# Patient Record
Sex: Female | Born: 1975 | Race: White | Hispanic: No | Smoking: Current every day smoker
Health system: Southern US, Community
[De-identification: ages and names within clinical notes are randomized; demographics above are authoritative.]

## PROBLEM LIST (undated history)

## (undated) DIAGNOSIS — J309 Allergic rhinitis, unspecified: Secondary | ICD-10-CM

## (undated) HISTORY — PX: TUBAL LIGATION: SHX77

## (undated) HISTORY — DX: Allergic rhinitis, unspecified: J30.9

---

## 1998-07-14 ENCOUNTER — Other Ambulatory Visit: Admission: RE | Admit: 1998-07-14 | Discharge: 1998-07-14 | Payer: Self-pay | Admitting: Gynecology

## 1999-06-27 ENCOUNTER — Emergency Department (HOSPITAL_COMMUNITY): Admission: EM | Admit: 1999-06-27 | Discharge: 1999-06-28 | Payer: Self-pay | Admitting: *Deleted

## 1999-08-17 ENCOUNTER — Other Ambulatory Visit: Admission: RE | Admit: 1999-08-17 | Discharge: 1999-08-17 | Payer: Self-pay | Admitting: Gynecology

## 1999-12-11 ENCOUNTER — Emergency Department (HOSPITAL_COMMUNITY): Admission: EM | Admit: 1999-12-11 | Discharge: 1999-12-11 | Payer: Self-pay | Admitting: Emergency Medicine

## 1999-12-29 ENCOUNTER — Ambulatory Visit (HOSPITAL_COMMUNITY): Admission: RE | Admit: 1999-12-29 | Discharge: 1999-12-29 | Payer: Self-pay | Admitting: Internal Medicine

## 1999-12-29 ENCOUNTER — Encounter: Payer: Self-pay | Admitting: Internal Medicine

## 2000-09-02 ENCOUNTER — Other Ambulatory Visit: Admission: RE | Admit: 2000-09-02 | Discharge: 2000-09-02 | Payer: Self-pay | Admitting: Obstetrics and Gynecology

## 2000-12-24 ENCOUNTER — Ambulatory Visit (HOSPITAL_COMMUNITY): Admission: RE | Admit: 2000-12-24 | Discharge: 2000-12-24 | Payer: Self-pay | Admitting: Obstetrics and Gynecology

## 2001-03-05 ENCOUNTER — Encounter: Payer: Self-pay | Admitting: Obstetrics and Gynecology

## 2001-03-05 ENCOUNTER — Ambulatory Visit (HOSPITAL_COMMUNITY): Admission: RE | Admit: 2001-03-05 | Discharge: 2001-03-05 | Payer: Self-pay | Admitting: Obstetrics and Gynecology

## 2001-03-06 ENCOUNTER — Inpatient Hospital Stay (HOSPITAL_COMMUNITY): Admission: AD | Admit: 2001-03-06 | Discharge: 2001-03-09 | Payer: Self-pay | Admitting: Obstetrics and Gynecology

## 2001-10-29 ENCOUNTER — Other Ambulatory Visit: Admission: RE | Admit: 2001-10-29 | Discharge: 2001-10-29 | Payer: Self-pay | Admitting: Obstetrics and Gynecology

## 2001-10-31 ENCOUNTER — Ambulatory Visit (HOSPITAL_COMMUNITY): Admission: RE | Admit: 2001-10-31 | Discharge: 2001-10-31 | Payer: Self-pay | Admitting: Obstetrics and Gynecology

## 2001-10-31 ENCOUNTER — Encounter: Payer: Self-pay | Admitting: Obstetrics and Gynecology

## 2001-12-04 ENCOUNTER — Ambulatory Visit (HOSPITAL_COMMUNITY): Admission: RE | Admit: 2001-12-04 | Discharge: 2001-12-04 | Payer: Self-pay | Admitting: Obstetrics & Gynecology

## 2002-01-02 ENCOUNTER — Inpatient Hospital Stay (HOSPITAL_COMMUNITY): Admission: AD | Admit: 2002-01-02 | Discharge: 2002-01-05 | Payer: Self-pay | Admitting: Obstetrics & Gynecology

## 2002-01-03 ENCOUNTER — Encounter: Payer: Self-pay | Admitting: Obstetrics & Gynecology

## 2002-02-04 ENCOUNTER — Inpatient Hospital Stay (HOSPITAL_COMMUNITY): Admission: AD | Admit: 2002-02-04 | Discharge: 2002-02-08 | Payer: Self-pay | Admitting: Obstetrics & Gynecology

## 2002-08-05 ENCOUNTER — Encounter: Payer: Self-pay | Admitting: Emergency Medicine

## 2002-08-05 ENCOUNTER — Emergency Department (HOSPITAL_COMMUNITY): Admission: EM | Admit: 2002-08-05 | Discharge: 2002-08-05 | Payer: Self-pay | Admitting: Emergency Medicine

## 2004-03-16 ENCOUNTER — Ambulatory Visit (HOSPITAL_COMMUNITY): Admission: RE | Admit: 2004-03-16 | Discharge: 2004-03-16 | Payer: Self-pay | Admitting: Internal Medicine

## 2005-10-26 IMAGING — CR DG WRIST COMPLETE 3+V*L*
1 series · 1 of 1 positions shown · non-contrast
Comparison: none

CLINICAL DATA: Wrist pain.
 RIGHT WRIST:
 There is no evidence of bone, joint or soft tissue abnormality. 
 IMPRESSION
 Normal right wrist.
 LEFT WRIST:
 There is no evidence of bone, joint, or soft tissue abnormality.
 Normal left wrist.

[view not recorded]
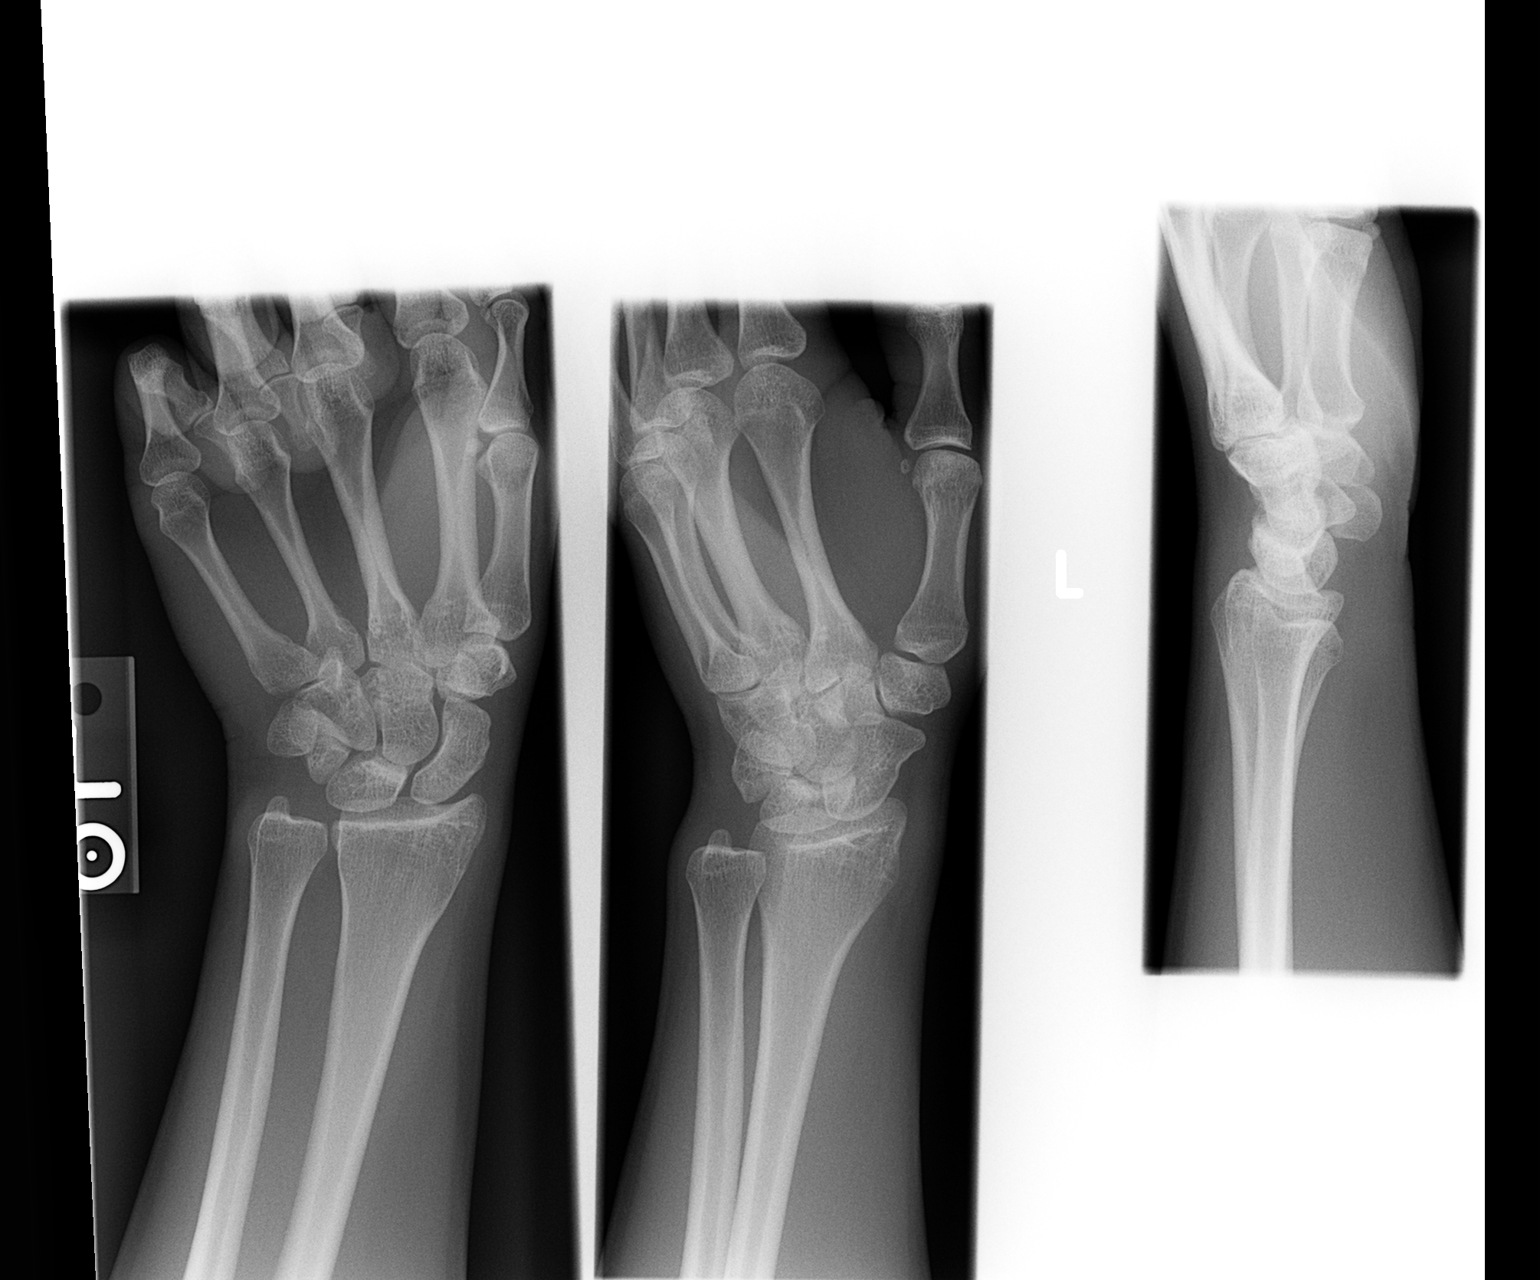

[1 of 1 positions shown; findings below may reference images not displayed]

## 2005-10-26 IMAGING — CR DG WRIST COMPLETE 3+V*R*
1 series · 1 of 1 positions shown · non-contrast
Comparison: none

CLINICAL DATA: Wrist pain.
 RIGHT WRIST:
 There is no evidence of bone, joint or soft tissue abnormality. 
 IMPRESSION
 Normal right wrist.
 LEFT WRIST:
 There is no evidence of bone, joint, or soft tissue abnormality.
 Normal left wrist.

[view not recorded]
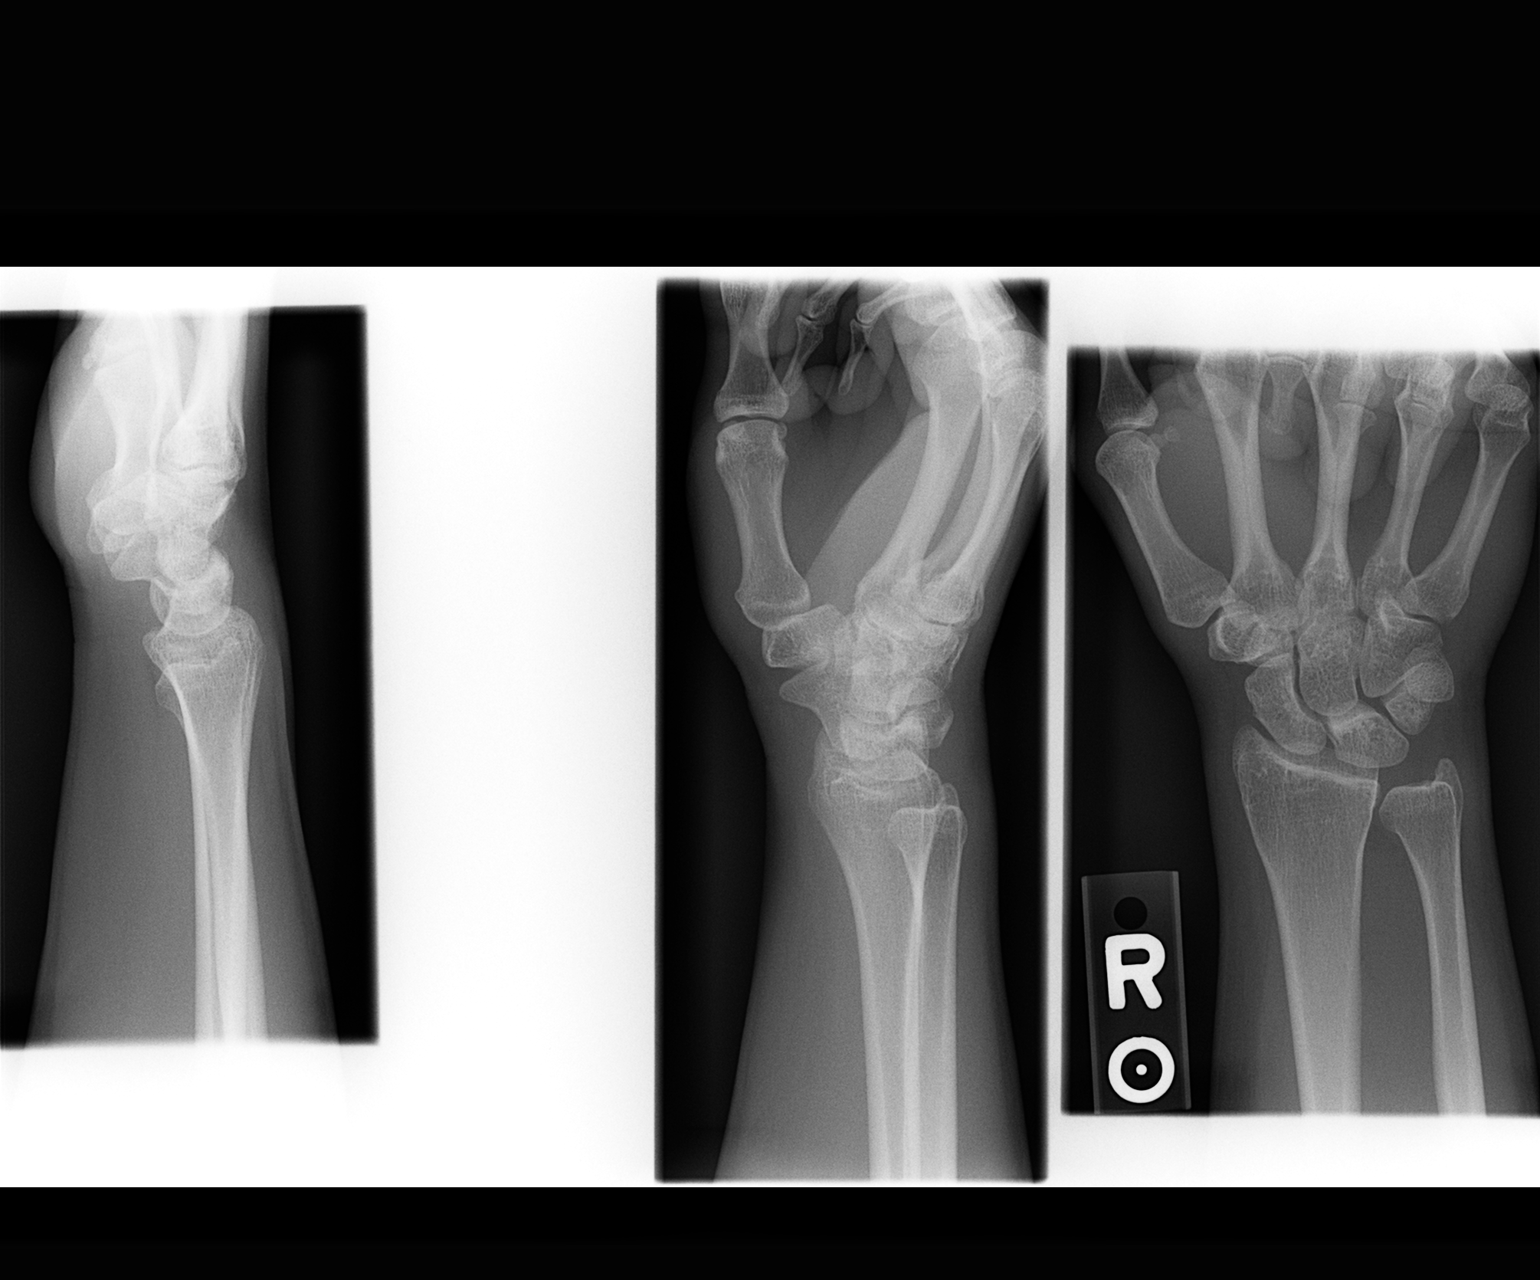

[1 of 1 positions shown; findings below may reference images not displayed]

## 2012-11-04 ENCOUNTER — Other Ambulatory Visit: Payer: Self-pay | Admitting: Emergency Medicine

## 2012-11-04 ENCOUNTER — Other Ambulatory Visit (HOSPITAL_COMMUNITY)
Admission: RE | Admit: 2012-11-04 | Discharge: 2012-11-04 | Disposition: A | Discharge status: Home / Self Care | Payer: Medicaid Other | Source: Ambulatory Visit | Attending: Internal Medicine | Admitting: Internal Medicine

## 2012-11-04 DIAGNOSIS — Z01419 Encounter for gynecological examination (general) (routine) without abnormal findings: Secondary | ICD-10-CM | POA: Insufficient documentation

## 2013-10-19 ENCOUNTER — Encounter (HOSPITAL_COMMUNITY): Payer: Self-pay | Admitting: Emergency Medicine

## 2013-10-19 ENCOUNTER — Other Ambulatory Visit (HOSPITAL_COMMUNITY)
Admission: RE | Admit: 2013-10-19 | Discharge: 2013-10-19 | Disposition: A | Discharge status: Home / Self Care | Payer: Medicaid Other | Source: Ambulatory Visit | Attending: Family Medicine | Admitting: Family Medicine

## 2013-10-19 ENCOUNTER — Emergency Department (INDEPENDENT_AMBULATORY_CARE_PROVIDER_SITE_OTHER)
Admission: EM | Admit: 2013-10-19 | Discharge: 2013-10-19 | Disposition: A | Discharge status: Home / Self Care | Payer: Self-pay | Source: Home / Self Care | Attending: Family Medicine | Admitting: Family Medicine

## 2013-10-19 DIAGNOSIS — N760 Acute vaginitis: Secondary | ICD-10-CM | POA: Insufficient documentation

## 2013-10-19 DIAGNOSIS — B9689 Other specified bacterial agents as the cause of diseases classified elsewhere: Secondary | ICD-10-CM

## 2013-10-19 DIAGNOSIS — Z113 Encounter for screening for infections with a predominantly sexual mode of transmission: Secondary | ICD-10-CM | POA: Insufficient documentation

## 2013-10-19 DIAGNOSIS — N390 Urinary tract infection, site not specified: Secondary | ICD-10-CM

## 2013-10-19 DIAGNOSIS — A499 Bacterial infection, unspecified: Secondary | ICD-10-CM

## 2013-10-19 LAB — POCT URINALYSIS DIP (DEVICE)
Bilirubin Urine: NEGATIVE
Ketones, ur: NEGATIVE mg/dL
Nitrite: POSITIVE — AB
Urobilinogen, UA: 0.2 mg/dL (ref 0.0–1.0)
pH: 7 (ref 5.0–8.0)

## 2013-10-19 LAB — POCT PREGNANCY, URINE: Preg Test, Ur: NEGATIVE

## 2013-10-19 MED ORDER — CEPHALEXIN 500 MG PO CAPS: 500.0000 mg | ORAL_CAPSULE | Freq: Four times a day (QID) | ORAL | Status: DC

## 2013-10-19 MED ORDER — METRONIDAZOLE 500 MG PO TABS: 500.0000 mg | ORAL_TABLET | Freq: Two times a day (BID) | ORAL | Status: DC

## 2013-10-19 NOTE — ED Provider Notes (Signed)
CSN: [id number]     Arrival date & time 10/19/13  4098 History   First MD Initiated Contact with Patient 10/19/13 1135     Chief Complaint  Patient presents with  . Urinary Tract Infection   (Consider location/radiation/quality/duration/timing/severity/associated sxs/prior Treatment) HPI Comments: Also c/o vaginal odor for 2 weeks. Denies discharge.  Reports odor is musky/fishy. No previous hx of vaginal infections. Last uti over a year ago. No recent antibiotics.   Patient is a 37 y.o. female presenting with urinary tract infection. The history is provided by the patient.  Urinary Tract Infection This is a new problem. Episode onset: 2 weeks ago. The problem occurs constantly. The problem has been gradually worsening. Pertinent negatives include no abdominal pain. Nothing aggravates the symptoms. Nothing relieves the symptoms. She has tried nothing for the symptoms.    History reviewed. No pertinent past medical history. History reviewed. No pertinent past surgical history. History reviewed. No pertinent family history. History  Substance Use Topics  . Smoking status: Current Every Day Smoker  . Smokeless tobacco: Not on file  . Alcohol Use: Yes   OB History   Grav Para Term Preterm Abortions TAB SAB Ect Mult Living                 Review of Systems  Constitutional: Negative for fever and chills.  Gastrointestinal: Negative for nausea, vomiting and abdominal pain.  Genitourinary: Positive for dysuria. Negative for flank pain and vaginal discharge.    Allergies  Review of patient's allergies indicates no known allergies.  Home Medications   Current Outpatient Rx  Name  Route  Sig  Dispense  Refill  . ALPRAZolam (XANAX) 0.5 MG tablet   Oral   Take 0.5 mg by mouth at bedtime as needed for anxiety.         . cephALEXin (KEFLEX) 500 MG capsule   Oral   Take 1 capsule (500 mg total) by mouth 4 (four) times daily.   20 capsule   0   . metroNIDAZOLE (FLAGYL) 500 MG  tablet   Oral   Take 1 tablet (500 mg total) by mouth 2 (two) times daily.   14 tablet   0    BP 110/75  Pulse 67  Temp(Src) 97.9 F (36.6 C) (Oral)  Resp 16  SpO2 100%  LMP 10/05/2013 Physical Exam  Constitutional: She appears well-developed and well-nourished. No distress.  Cardiovascular: Normal rate and regular rhythm.   Pulmonary/Chest: Effort normal and breath sounds normal.  Abdominal: Soft. Bowel sounds are normal. There is no tenderness. There is no CVA tenderness.  Genitourinary: There is no rash, tenderness or lesion on the right labia. There is no rash, tenderness or lesion on the left labia. Cervix exhibits no motion tenderness, no discharge and no friability. No tenderness or bleeding around the vagina. Vaginal discharge found.  Lymphadenopathy:       Right: No inguinal adenopathy present.       Left: No inguinal adenopathy present.    ED Course  Procedures (including critical care time) Labs Review Labs Reviewed  POCT URINALYSIS DIP (DEVICE) - Abnormal; Notable for the following:    Hgb urine dipstick TRACE (*)    Nitrite POSITIVE (*)    Leukocytes, UA LARGE (*)    All other components within normal limits  POCT PREGNANCY, URINE  CERVICOVAGINAL ANCILLARY ONLY   Imaging Review No results found.  EKG Interpretation    Date/Time:    Ventricular Rate:    PR Interval:  QRS Duration:   QT Interval:    QTC Calculation:   R Axis:     Text Interpretation:              MDM   1. UTI (lower urinary tract infection)   2. Bacterial vaginosis   Suspect BV. Rx flagyl 500mg  BID #14. Rx keflex 500mg  QID #20 for uti.      Cathlyn Parsons, NP 10/19/13 1155

## 2013-10-19 NOTE — ED Notes (Signed)
C/o discomfort w urination and vaginal odor x 2 weeks

## 2013-10-19 NOTE — ED Notes (Signed)
Call back number for lab issues verified 

## 2013-10-19 NOTE — ED Provider Notes (Signed)
Medical screening examination/treatment/procedure(s) were performed by a resident physician or non-physician practitioner and as the supervising physician I was immediately available for consultation/collaboration.  Savvy Peeters, MD      Ariyona Eid S Larri Brewton, MD 10/19/13 2111 

## 2013-10-20 NOTE — ED Notes (Signed)
GC/Chlamydia neg., Affirm: Candida and Trich neg., Gardnerella pos. Pt. adequately treated with Flagyl. Vassie Moselle 10/20/2013

## 2019-09-07 ENCOUNTER — Ambulatory Visit: Payer: Self-pay | Admitting: Physician Assistant

## 2019-09-15 ENCOUNTER — Ambulatory Visit (INDEPENDENT_AMBULATORY_CARE_PROVIDER_SITE_OTHER): Payer: 59

## 2019-09-15 ENCOUNTER — Other Ambulatory Visit: Payer: Self-pay

## 2019-09-15 DIAGNOSIS — Z23 Encounter for immunization: Secondary | ICD-10-CM | POA: Diagnosis not present

## 2019-09-15 NOTE — Progress Notes (Signed)
REPORTS FOR LOW DOSE FLU 

## 2019-11-30 ENCOUNTER — Encounter: Payer: Self-pay | Admitting: Adult Health

## 2019-11-30 ENCOUNTER — Ambulatory Visit (INDEPENDENT_AMBULATORY_CARE_PROVIDER_SITE_OTHER): Payer: 59 | Admitting: Adult Health

## 2019-11-30 ENCOUNTER — Other Ambulatory Visit: Payer: Self-pay

## 2019-11-30 VITALS — BP 128/80 | HR 101 | Temp 97.9°F | Wt 165.0 lb

## 2019-11-30 DIAGNOSIS — J019 Acute sinusitis, unspecified: Secondary | ICD-10-CM | POA: Diagnosis not present

## 2019-11-30 DIAGNOSIS — J302 Other seasonal allergic rhinitis: Secondary | ICD-10-CM | POA: Diagnosis not present

## 2019-11-30 DIAGNOSIS — F17200 Nicotine dependence, unspecified, uncomplicated: Secondary | ICD-10-CM | POA: Insufficient documentation

## 2019-11-30 DIAGNOSIS — J309 Allergic rhinitis, unspecified: Secondary | ICD-10-CM | POA: Insufficient documentation

## 2019-11-30 MED ORDER — FLUTICASONE PROPIONATE 50 MCG/ACT NA SUSP: 2.0000 | Freq: Every day | NASAL | 1 refills | Status: AC

## 2019-11-30 MED ORDER — PREDNISONE 20 MG PO TABS
ORAL_TABLET | ORAL | 0 refills | Status: DC
Start: 2019-11-30 — End: 2019-12-07

## 2019-11-30 NOTE — Patient Instructions (Signed)
  Get on a daily allergy medication     HOW TO TREAT VIRAL COUGH AND COLD SYMPTOMS:  -Symptoms usually last at least 1 week with the worst symptoms being around day 4.  - colds usually start with a sore throat and end with a cough, and the cough can take 2 weeks to get better.  -No antibiotics are needed for colds, flu, sore throats, cough, bronchitis UNLESS symptoms are longer than 7 days OR if you are getting better then get drastically worse.  -There are a lot of combination medications (Dayquil, Nyquil, Vicks 44, tyelnol cold and sinus, ETC). Please look at the ingredients on the back so that you are treating the correct symptoms and not doubling up on medications/ingredients.    Medicines you can use  Nasal congestion  Little Remedies saline spray (aerosol/mist)- can try this, it is in the kids section - pseudoephedrine (Sudafed)- behind the counter, do not use if you have high blood pressure, medicine that have -D in them.  - phenylephrine (Sudafed PE) -Dextormethorphan + chlorpheniramine (Coridcidin HBP)- okay if you have high blood pressure -Oxymetazoline (Afrin) nasal spray- LIMIT to 3 days -Saline nasal spray -Neti pot (used distilled or bottled water)  Ear pain/congestion  -pseudoephedrine (sudafed) - Nasonex/flonase nasal spray  Fever  -Acetaminophen (Tyelnol) -Ibuprofen (Advil, motrin, aleve)  Sore Throat  -Acetaminophen (Tyelnol) -Ibuprofen (Advil, motrin, aleve) -Drink a lot of water -Gargle with salt water - Rest your voice (don't talk) -Throat sprays -Cough drops  Body Aches  -Acetaminophen (Tyelnol) -Ibuprofen (Advil, motrin, aleve)  Headache  -Acetaminophen (Tyelnol) -Ibuprofen (Advil, motrin, aleve) - Exedrin, Exedrin Migraine  Allergy symptoms (cough, sneeze, runny nose, itchy eyes) -Claritin or loratadine cheapest but likely the weakest  -Zyrtec or certizine at night because it can make you sleepy -The strongest is allegra or fexafinadine   Cheapest at [employer], [employer], [employer]  Cough  -Dextromethorphan (Delsym)- medicine that has DM in it -Guafenesin (Mucinex/Robitussin) - cough drops - drink lots of water  Chest Congestion  -Guafenesin (Mucinex/Robitussin)  Red Itchy Eyes  - Naphcon-A  Upset Stomach  - Bland diet (nothing spicy, greasy, fried, and high acid foods like tomatoes, oranges, berries) -OKAY- cereal, bread, soup, crackers, rice -Eat smaller more frequent meals -reduce caffeine, no alcohol -Loperamide (Imodium-AD) if diarrhea -Prevacid for heart burn  General health when sick  -Hydration -wash your hands frequently -keep surfaces clean -change pillow cases and sheets often -Get fresh air but do not exercise strenuously -Vitamin D, double up on it - Vitamin C -Zinc

## 2019-11-30 NOTE — Progress Notes (Signed)
Assessment and Plan:  Audrey Keith was seen today for sinusitis and ear fullness.  Diagnoses and all orders for this visit:  Acute rhinosinusitis Allergic vs viral; Discussed the importance of avoiding unnecessary antibiotic therapy; doesn't currently meet criteria for abx Suggested symptomatic OTC remedies. Nasal saline spray for congestion. Nasal steroids, allergy pill, oral steroids offered She will be getting covid 19 testing today Follow up as needed if symptoms are persistent longer than 10-14 days, new progressive sx, fever can add abx STOP SMOKING -     predniSONE (DELTASONE) 20 MG tablet; 2 tablets daily for 3 days, 1 tablet daily for 4 days. -     fluticasone (FLONASE) 50 MCG/ACT nasal spray; Place 2 sprays into both nostrils at bedtime.  Tobacco use Discussed risks associated with tobacco use and advised to reduce or quit Patient is not ready to do so, but advised to consider strongly Will follow up at the next visit   Further disposition pending results of labs. Discussed med's effects and SE's.   Over 15 minutes of exam, counseling, chart review, and critical decision making was performed.   Future Appointments  Date Time Provider Department Center  11/30/2019  3:45 PM Judd Gaudier, NP [facility] None    ------------------------------------------------------------------------------------------------------------------   HPI BP 128/80   Pulse (!) 101   Temp 97.9 F (36.6 C)   Wt 165 lb (74.8 kg)   SpO2 97%   44 y.o.female, smoker, fall/spring allergies presents for evaluation of 4 day URI sx.   She reports on New Years day started having nasal drainage, sneezing, vaguely feeling bad and clammy though no fever. She denies cough, sore throat, changes in taste or smell, dyspnea, wheezing, CP.   She reports sneezing worse in AM, more nasal congestion, starting to have ear fullness bilaterally without pain/discharge, denies tinnitis or changes in hearing. Overall  stable/persistent symptoms.   She has taken some tylenol for ear discomfort which has helped. She reports doesn't like taking meds and usually just tries to power through allergy flares. May do flonase but hasn't tried this time.   She has been avoiding going out, works here in our office answering phones.  No known sick contacts. Did see some family over Christmas but no one else reporting any sx. Husband and kids are asymptomatic. Will be going to get covid 19 testing to be safe.    Past Medical History:  Diagnosis Date  . Allergic rhinitis      No Known Allergies  Current Outpatient Medications on File Prior to Visit  Medication Sig  . Zinc 50 MG CAPS Take by mouth daily.   No current facility-administered medications on file prior to visit.    ROS: all negative except above.   Physical Exam:  BP 128/80   Pulse (!) 101   Temp 97.9 F (36.6 C)   Wt 165 lb (74.8 kg)   SpO2 97%   General Appearance: Well nourished, in no apparent distress. Eyes: PERRLA, conjunctiva no swelling or erythema Sinuses: Mil bil maxillary tenderness without overlying edema/erythema; no frontal sinus tenderness ENT/Mouth: L Ext aud canals clear, R with scant wax; TMs without erythema, bulging. No erythema, swelling, or exudate on post pharynx.  Tonsils not visible. Hearing normal.  Neck: Supple, thyroid normal.  Respiratory: Respiratory effort normal, BS equal bilaterally without rales, rhonchi, wheezing or stridor.  Cardio: RRR with no MRGs. Brisk peripheral pulses without edema.  Abdomen: Soft, + BS.  Non tender. Lymphatics: Non tender without lymphadenopathy.  Musculoskeletal: No  obvious deformity, normal gait.  Skin: Warm, dry without rashes, lesions, ecchymosis.  Neuro: Normal muscle tone, no cerebellar symptoms. Sensation intact.  Psych: Awake and oriented X 3, normal affect, Insight and Judgment appropriate.     [name], NP 2:12 PM [facility]

## 2019-12-01 ENCOUNTER — Ambulatory Visit: Payer: Medicaid Other | Attending: Internal Medicine

## 2019-12-01 DIAGNOSIS — Z20822 Contact with and (suspected) exposure to covid-19: Secondary | ICD-10-CM

## 2019-12-03 ENCOUNTER — Other Ambulatory Visit: Payer: Self-pay | Admitting: Internal Medicine

## 2019-12-03 LAB — NOVEL CORONAVIRUS, NAA: SARS-CoV-2, NAA: DETECTED — AB

## 2019-12-07 ENCOUNTER — Other Ambulatory Visit: Payer: Self-pay | Admitting: Internal Medicine

## 2019-12-07 MED ORDER — DEXAMETHASONE 4 MG PO TABS: ORAL_TABLET | ORAL | 0 refills | Status: DC

## 2020-03-01 ENCOUNTER — Other Ambulatory Visit: Payer: Self-pay | Admitting: Internal Medicine

## 2020-03-01 MED ORDER — DEXAMETHASONE 4 MG PO TABS: ORAL_TABLET | ORAL | 0 refills | Status: AC

## 2020-03-01 MED ORDER — MONTELUKAST SODIUM 10 MG PO TABS: ORAL_TABLET | ORAL | 3 refills | Status: AC

## 2020-03-30 ENCOUNTER — Other Ambulatory Visit: Payer: Self-pay

## 2020-03-30 ENCOUNTER — Other Ambulatory Visit: Payer: Self-pay | Admitting: *Deleted

## 2020-03-30 ENCOUNTER — Other Ambulatory Visit: Payer: 59

## 2020-03-30 ENCOUNTER — Other Ambulatory Visit: Payer: Self-pay | Admitting: Adult Health

## 2020-03-30 DIAGNOSIS — R300 Dysuria: Secondary | ICD-10-CM

## 2020-03-30 MED ORDER — PHENAZOPYRIDINE HCL 100 MG PO TABS: 200.0000 mg | ORAL_TABLET | Freq: Three times a day (TID) | ORAL | 0 refills | Status: AC | PRN

## 2020-03-30 MED ORDER — SULFAMETHOXAZOLE-TRIMETHOPRIM 800-160 MG PO TABS: 1.0000 | ORAL_TABLET | Freq: Two times a day (BID) | ORAL | 0 refills | Status: AC

## 2020-04-01 LAB — URINALYSIS, ROUTINE W REFLEX MICROSCOPIC
Bilirubin Urine: NEGATIVE
Glucose, UA: NEGATIVE
Hyaline Cast: NONE SEEN /LPF
Leukocytes,Ua: NEGATIVE
Nitrite: POSITIVE — AB
Protein, ur: NEGATIVE
RBC / HPF: NONE SEEN /HPF (ref 0–2)
Specific Gravity, Urine: 1.015 (ref 1.001–1.03)
Squamous Epithelial / LPF: NONE SEEN /HPF (ref <=5)
WBC, UA: NONE SEEN /HPF (ref 0–5)
pH: 6 (ref 5.0–8.0)

## 2020-04-01 LAB — URINE CULTURE
MICRO NUMBER:: 10443301
SPECIMEN QUALITY:: ADEQUATE

## 2020-06-08 ENCOUNTER — Emergency Department (HOSPITAL_COMMUNITY)
Admission: EM | Admit: 2020-06-08 | Discharge: 2020-06-08 | Discharge status: Against Medical Advice | Payer: Medicaid Other

## 2020-06-08 NOTE — ED Notes (Signed)
Pt called for triage! No answer x2!

## 2020-06-08 NOTE — ED Notes (Signed)
Pt called for triage. No answer x1!
